# Patient Record
Sex: Male | Born: 1995 | Race: Black or African American | Marital: Single | Smoking: Never smoker
Health system: Southern US, Community
[De-identification: ages and names within clinical notes are randomized; demographics above are authoritative.]

## PROBLEM LIST (undated history)

## (undated) DIAGNOSIS — T1591XA Foreign body on external eye, part unspecified, right eye, initial encounter: Secondary | ICD-10-CM

## (undated) DIAGNOSIS — J45909 Unspecified asthma, uncomplicated: Secondary | ICD-10-CM

---
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
## 2017-08-08 ENCOUNTER — Encounter: Payer: Self-pay | Admitting: Emergency Medicine

## 2017-08-08 ENCOUNTER — Other Ambulatory Visit: Payer: Self-pay

## 2017-08-08 ENCOUNTER — Ambulatory Visit
Admission: EM | Admit: 2017-08-08 | Discharge: 2017-08-08 | Disposition: A | Discharge status: Home / Self Care | Payer: BLUE CROSS/BLUE SHIELD | Attending: Nurse Practitioner | Admitting: Nurse Practitioner

## 2017-08-08 DIAGNOSIS — W268XXA Contact with other sharp object(s), not elsewhere classified, initial encounter: Secondary | ICD-10-CM | POA: Diagnosis not present

## 2017-08-08 DIAGNOSIS — Z23 Encounter for immunization: Secondary | ICD-10-CM | POA: Diagnosis not present

## 2017-08-08 DIAGNOSIS — S61210A Laceration without foreign body of right index finger without damage to nail, initial encounter: Secondary | ICD-10-CM | POA: Diagnosis not present

## 2017-08-08 MED ORDER — TETANUS-DIPHTH-ACELL PERTUSSIS 5-2.5-18.5 LF-MCG/0.5 IM SUSP
0.5000 mL | Freq: Once | INTRAMUSCULAR | Status: AC
  Administered 2017-08-08: 0.5 mL via INTRAMUSCULAR

## 2017-08-08 NOTE — ED Provider Notes (Signed)
[facility]    CSN: [id number] Arrival date & time: 08/08/17  1623     History   Chief Complaint No chief complaint on file.   HPI Nicholas Hensley is a 22 y.o. male.    Subjective:  Nicholas Hensley is a 22 y.o. male who presents for evaluation of a laceration to the right index finger. Injury occurred just prior to arrival. The mechanism of the wound was metal edge. Patient reports that he was carrying a box and somehow cut his finger on a piece of metal that was sticking out of the box. The patient denies any coldness, numbness or paresthesias in the affected area. There was not any other injuries.  The tetanus status is more than 5 years ago.  The following portions of the patient's history were reviewed and updated as appropriate: allergies, current medications, past family history, past medical history, past social history, past surgical history and problem list.       Past Medical History:  Diagnosis Date  . Asthma   . Foreign body of right eye     There are no active problems to display for this patient.   No past surgical history on file.     Home Medications    Prior to Admission medications   Medication Sig Start Date End Date Taking? Authorizing Provider  naproxen (NAPROSYN) 500 MG tablet Take 1 tablet (500 mg total) by mouth 2 (two) times daily with a meal. 10/23/15   Joni Reining, PA-C    Family History No family history on file.  Social History Social History   Tobacco Use  . Smoking status: Never Smoker  Substance Use Topics  . Alcohol use: No  . Drug use: Not on file     Allergies   Patient has no known allergies.   Review of Systems Review of Systems  Skin: Positive for wound.  All other systems reviewed and are negative.    Physical Exam Triage Vital Signs ED Triage Vitals  Enc Vitals Group     BP      Pulse      Resp      Temp      Temp src      SpO2      Weight      Height      Head Circumference    Peak Flow      Pain Score      Pain Loc      Pain Edu?      Excl. in GC?    No data found.  Updated Vital Signs There were no vitals taken for this visit.  Visual Acuity Right Eye Distance:   Left Eye Distance:   Bilateral Distance:    Right Eye Near:   Left Eye Near:    Bilateral Near:     Physical Exam  Constitutional: He is oriented to person, place, and time. He appears well-developed and well-nourished.  Cardiovascular: Normal rate and regular rhythm.  Pulmonary/Chest: Effort normal and breath sounds normal.  Musculoskeletal: Normal range of motion.  Neurological: He is alert and oriented to person, place, and time.  Skin: Skin is warm and dry.  approximately 2.5 cm linear laceration to the posterior aspect of the right index finger   Psychiatric: He has a normal mood and affect.         UC Treatments / Results  Labs (all labs ordered are listed, but only abnormal results are displayed) Labs Reviewed -  No data to display  EKG None  Radiology No results found.  Procedures Laceration Repair Date/Time: 08/08/2017 5:17 PM Performed by: Lurline IdolMurrill, Geovanni Rahming, FNP Authorized by: Lurline IdolMurrill, Wally Behan, FNP   Consent:    Consent obtained:  Verbal   Consent given by:  Patient   Risks discussed:  Infection, pain, poor cosmetic result and poor wound healing   Alternatives discussed:  No treatment Anesthesia (see MAR for exact dosages):    Anesthesia method:  Local infiltration   Local anesthetic:  Lidocaine 1% w/o epi Laceration details:    Location:  Finger   Finger location:  R index finger   Length (cm):  2.5 Repair type:    Repair type:  Simple Pre-procedure details:    Preparation:  Patient was prepped and draped in usual sterile fashion Exploration:    Hemostasis achieved with:  Direct pressure   Wound exploration: entire depth of wound probed and visualized     Wound extent: fascia violated     Contaminated: no   Treatment:    Area cleansed with:   Saline   Amount of cleaning:  Standard   Irrigation solution:  Sterile saline   Irrigation method:  Pressure wash Skin repair:    Repair method:  Sutures   Suture size:  5-0   Suture material:  Prolene   Suture technique:  Simple interrupted   Number of sutures:  11 Approximation:    Approximation:  Close Post-procedure details:    Dressing:  Antibiotic ointment and non-adherent dressing   Patient tolerance of procedure:  Tolerated well, no immediate complications   (including critical care time)  Medications Ordered in UC Medications - No data to display  Initial Impression / Assessment and Plan / UC Course  I have reviewed the triage vital signs and the nursing notes.  Pertinent labs & imaging results that were available during my care of the patient were reviewed by me and considered in my medical decision making (see chart for details).    22 yo male presenting with a laceration to the right index finger that occurred just prior to arrival.   Plan:  - Wound care discussed. - Tetanus immunization given. - Suture removal in 7 days.  Today's evaluation has revealed no signs of a dangerous process. Discussed diagnosis with patient. Patient aware of their diagnosis, possible red flag symptoms to watch out for and need for close follow up. Patient understands verbal and written discharge instructions. Patient comfortable with plan and disposition.  Patient has a clear mental status at this time, good insight into illness (after discussion and teaching) and has clear judgment to make decisions regarding their care.  Documentation was completed with the aid of voice recognition software. Transcription may contain typographical errors.   Final Clinical Impressions(s) / UC Diagnoses   Final diagnoses:  Laceration of right index finger without foreign body without damage to nail, initial encounter   Discharge Instructions   None    ED Prescriptions    None     Controlled  Substance Prescriptions [place] Controlled Substance Registry consulted? Not Applicable   Lurline IdolMurrill, Joniah Bednarski, [place] 08/08/17 1719

## 2017-08-08 NOTE — ED Triage Notes (Signed)
Patient states that he cut his right 2nd finger on something sharp inside a box that he was lifting.  Patient states that it might have been metal. Patient has a laceration to his right 2nd finger.

## 2017-08-11 ENCOUNTER — Other Ambulatory Visit: Payer: Self-pay

## 2017-08-11 ENCOUNTER — Ambulatory Visit
Admission: EM | Admit: 2017-08-11 | Discharge: 2017-08-11 | Disposition: A | Discharge status: Home / Self Care | Payer: BLUE CROSS/BLUE SHIELD | Attending: Family Medicine | Admitting: Family Medicine

## 2017-08-11 DIAGNOSIS — Z5189 Encounter for other specified aftercare: Secondary | ICD-10-CM

## 2017-08-11 DIAGNOSIS — S63502A Unspecified sprain of left wrist, initial encounter: Secondary | ICD-10-CM

## 2017-08-11 MED ORDER — IBUPROFEN 800 MG PO TABS: 800.0000 mg | ORAL_TABLET | Freq: Three times a day (TID) | ORAL | 0 refills | Status: AC | PRN

## 2017-08-11 NOTE — ED Provider Notes (Signed)
[facility]    CSN: [id number] Arrival date & time: 08/11/17  1328     History   Chief Complaint Chief Complaint  Patient presents with  . Wound Check    HPI Nicholas Hensley is a 22 y.o. male.   Nicholas Hensley presents for recheck of his sutures to his right pointer finger. Cut it accidentally on a piece of metal three days ago while picking up a box. Was seen here and 11 sutures were placed. Patient states yesterday morning he woke and noticed that a few had come out and had some bleeding. He was concerned that he may need these replaced. No further drainage, redness, swelling or increased pain. Has been cleansing daily. Also complaints of left wrist pain, exacerbated when doing pushups. No specific injury to the wrist. States he had been boxing a few weeks ago. No swelling. Denies any previous injury. Has not taken any medications for pain. History of asthma.    ROS per HPI.      Past Medical History:  Diagnosis Date  . Asthma   . Foreign body of right eye     There are no active problems to display for this patient.   History reviewed. No pertinent surgical history.     Home Medications    Prior to Admission medications   Medication Sig Start Date End Date Taking? Authorizing Provider  ibuprofen (ADVIL,MOTRIN) 800 MG tablet Take 1 tablet (800 mg total) by mouth every 8 (eight) hours as needed for mild pain or moderate pain. 08/11/17   Georgetta Haber, NP    Family History History reviewed. No pertinent family history.  Social History Social History   Tobacco Use  . Smoking status: Never Smoker  . Smokeless tobacco: Never Used  Substance Use Topics  . Alcohol use: No  . Drug use: Never     Allergies   Patient has no known allergies.   Review of Systems Review of Systems   Physical Exam Triage Vital Signs ED Triage Vitals  Enc Vitals Group     BP 08/11/17 1335 127/72     Pulse Rate 08/11/17 1335 67     Resp 08/11/17 1335 18     Temp  08/11/17 1335 98.8 F (37.1 C)     Temp Source 08/11/17 1335 Oral     SpO2 08/11/17 1335 100 %     Weight 08/11/17 1336 180 lb (81.6 kg)     Height 08/11/17 1336 5\' 7"  (1.702 m)     Head Circumference --      Peak Flow --      Pain Score 08/11/17 1336 2     Pain Loc --      Pain Edu? --      Excl. in GC? --    No data found.  Updated Vital Signs BP 127/72 (BP Location: Left Arm)   Pulse 67   Temp 98.8 F (37.1 C) (Oral)   Resp 18   Ht 5\' 7"  (1.702 m)   Wt 180 lb (81.6 kg)   SpO2 100%   BMI 28.19 kg/m   Visual Acuity Right Eye Distance:   Left Eye Distance:   Bilateral Distance:    Right Eye Near:   Left Eye Near:    Bilateral Near:     Physical Exam  Constitutional: He is oriented to person, place, and time. He appears well-developed and well-nourished.  Cardiovascular: Normal rate and regular rhythm.  Pulmonary/Chest: Effort normal and breath sounds normal.  Musculoskeletal:       Left wrist: He exhibits normal range of motion, no tenderness, no bony tenderness, no swelling, no effusion, no crepitus, no deformity and no laceration.  Full ROM to left wrist; cap refill WNL; strong radial pulse   Neurological: He is alert and oriented to person, place, and time.  Skin: Skin is warm and dry.  Right hand pointer finger with healing laceration, close approximation without any gap or dehiscence of wound noted; scant dried blood; no redness, no tenderness; cap refill <2sec     UC Treatments / Results  Labs (all labs ordered are listed, but only abnormal results are displayed) Labs Reviewed - No data to display  EKG None  Radiology No results found.  Procedures Procedures (including critical care time)  Medications Ordered in UC Medications - No data to display  Initial Impression / Assessment and Plan / UC Course  I have reviewed the triage vital signs and the nursing notes.  Pertinent labs & imaging results that were available during my care of the  patient were reviewed by me and considered in my medical decision making (see chart for details).     Reassurance provided in regards to healing of right pointer finger. Wound care discussed. Cleansed and redressed here today. Left wrist without acute findings on exam, consistent with sprain. Ice, rest, elevation, ibuprofen for pain control. If symptoms worsen or do not improve in the next week to return to be seen or to follow up with PCP.  Patient verbalized understanding and agreeable to plan.  Return in 4-5 days for suture removal.  Final Clinical Impressions(s) / UC Diagnoses   Final diagnoses:  Visit for wound check  Sprain of left wrist, initial encounter     Discharge Instructions     Cleanse wound daily, pat dry, keep covered to keep clean. Try to limit use to prevent further irritation of sutures and healing.  Ice, elevation, rest, ibuprofen for left wrist pain.  May advance activity as tolerated once pain improves.  Return in another 4-5 days for suture removal.  Return sooner if signs of infection such as redness, swelling, drainage or increased pain.    ED Prescriptions    Medication Sig Dispense Auth. Provider   ibuprofen (ADVIL,MOTRIN) 800 MG tablet Take 1 tablet (800 mg total) by mouth every 8 (eight) hours as needed for mild pain or moderate pain. 21 tablet Georgetta HaberBurky, Toriann Spadoni B, NP     Controlled Substance Prescriptions [place] Controlled Substance Registry consulted? Not Applicable   Georgetta HaberBurky, Mckensie Scotti B, NP 08/11/17 1401

## 2017-08-11 NOTE — ED Triage Notes (Signed)
Pt with sutures to right index finger on 6/26. States one or two have come out "while I was asleep." Also wants his left wrist checked as it's been bothering him over the past several weeks.

## 2017-08-11 NOTE — Discharge Instructions (Signed)
Cleanse wound daily, pat dry, keep covered to keep clean. Try to limit use to prevent further irritation of sutures and healing.  Ice, elevation, rest, ibuprofen for left wrist pain.  May advance activity as tolerated once pain improves.  Return in another 4-5 days for suture removal.  Return sooner if signs of infection such as redness, swelling, drainage or increased pain.

## 2017-08-20 ENCOUNTER — Ambulatory Visit
Admission: EM | Admit: 2017-08-20 | Discharge: 2017-08-20 | Disposition: A | Discharge status: Home / Self Care | Payer: BLUE CROSS/BLUE SHIELD

## 2017-08-20 ENCOUNTER — Other Ambulatory Visit: Payer: Self-pay

## 2017-08-20 ENCOUNTER — Encounter: Payer: Self-pay | Admitting: Emergency Medicine

## 2017-08-20 NOTE — ED Triage Notes (Signed)
Patient here for suture removal to his right index finger.

## 2017-09-11 IMAGING — CR DG FINGER THUMB 2+V*R*
3 series · 3 of 3 positions shown · non-contrast
Comparison: None.

CLINICAL DATA: Pain and swelling of the right thumb.

EXAM:
RIGHT THUMB 2+V

[finger ap]
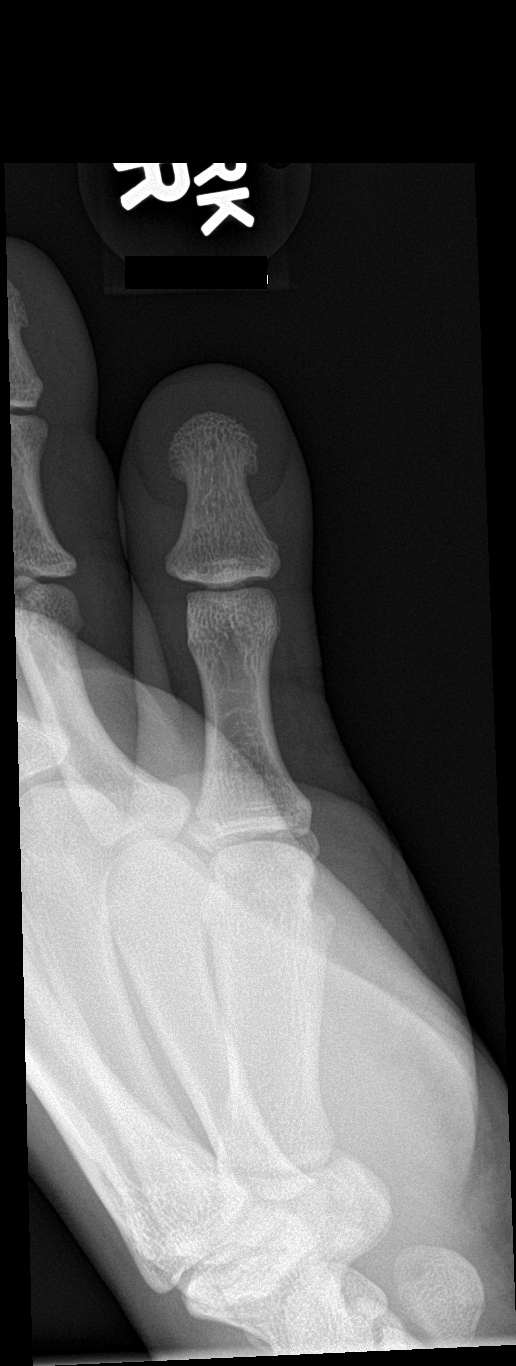

[finger obl]
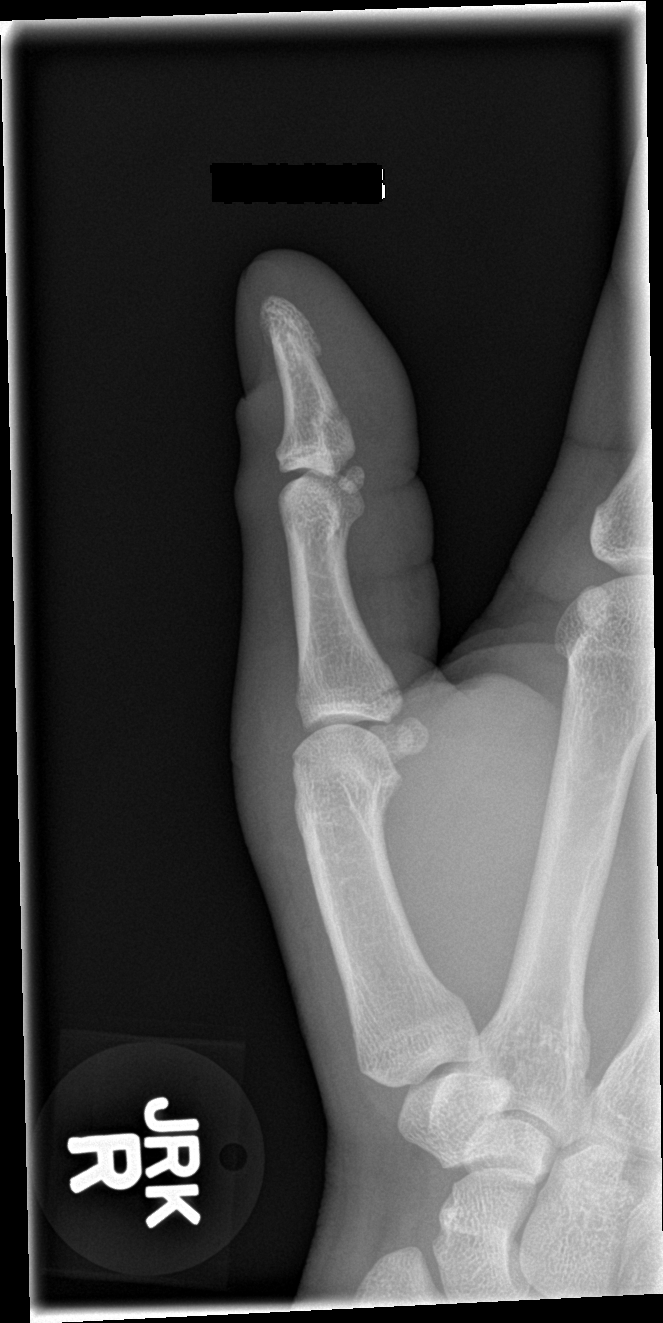

[finger lat]
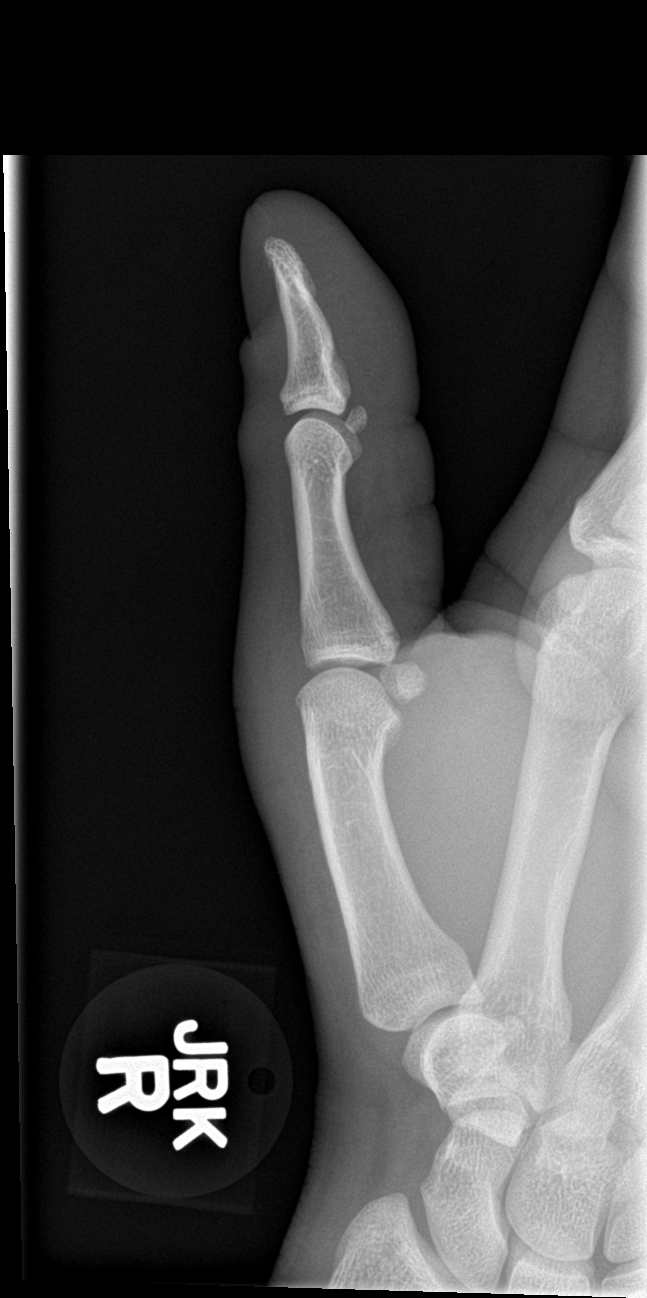

[3 of 3 positions shown; findings below may reference images not displayed]

FINDINGS: Curvilinear lucency through the base of the first proximal phalanx
likely represents an incomplete fracture. No evidence of
intra-articular extension. There is an associated soft tissue
swelling.
IMPRESSION: Probable incomplete fracture of the base of the first proximal
phalanx.

## 2018-10-31 ENCOUNTER — Other Ambulatory Visit: Payer: Self-pay

## 2018-10-31 ENCOUNTER — Encounter: Payer: Self-pay | Admitting: Emergency Medicine

## 2018-10-31 DIAGNOSIS — J45909 Unspecified asthma, uncomplicated: Secondary | ICD-10-CM | POA: Diagnosis not present

## 2018-10-31 DIAGNOSIS — Z91010 Allergy to peanuts: Secondary | ICD-10-CM | POA: Insufficient documentation

## 2018-10-31 DIAGNOSIS — M542 Cervicalgia: Secondary | ICD-10-CM | POA: Insufficient documentation
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
## 2018-11-01 ENCOUNTER — Emergency Department
Admission: EM | Admit: 2018-11-01 | Discharge: 2018-11-01 | Disposition: A | Discharge status: Home / Self Care | Payer: No Typology Code available for payment source | Attending: Emergency Medicine | Admitting: Emergency Medicine

## 2018-11-01 ENCOUNTER — Emergency Department: Payer: No Typology Code available for payment source

## 2018-11-01 DIAGNOSIS — M542 Cervicalgia: Secondary | ICD-10-CM

## 2018-11-01 MED ORDER — IBUPROFEN 400 MG PO TABS
400.0000 mg | ORAL_TABLET | Freq: Once | ORAL | Status: AC
  Administered 2018-11-01: 400 mg via ORAL
  Filled 2018-11-01: qty 1

## 2018-11-01 NOTE — ED Notes (Signed)
Paper copy signed at d/c d/t e-signature pad not working.

## 2018-11-01 NOTE — ED Notes (Signed)
EDP to bedside. 
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
## 2018-11-01 NOTE — ED Notes (Addendum)
[employer] called and given a 5-10 minute eta. Pt a&o and independent. Pt discharged by previous rn. Vital remain WDL, no new acute complaints/concerns
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
